# Patient Record
Sex: Male | Born: 1995 | Race: White | Hispanic: No | Marital: Single | Smoking: Never smoker
Health system: Southern US, Community
[De-identification: ages and names within clinical notes are randomized; demographics above are authoritative.]

## PROBLEM LIST (undated history)

## (undated) HISTORY — PX: TONSILLECTOMY: SUR1361

---

## 2018-06-09 ENCOUNTER — Other Ambulatory Visit: Payer: Self-pay

## 2018-06-09 ENCOUNTER — Ambulatory Visit (INDEPENDENT_AMBULATORY_CARE_PROVIDER_SITE_OTHER): Payer: No Typology Code available for payment source

## 2018-06-09 ENCOUNTER — Encounter (HOSPITAL_BASED_OUTPATIENT_CLINIC_OR_DEPARTMENT_OTHER): Payer: Self-pay | Admitting: *Deleted

## 2018-06-09 ENCOUNTER — Encounter (INDEPENDENT_AMBULATORY_CARE_PROVIDER_SITE_OTHER): Payer: Self-pay | Admitting: Orthopaedic Surgery

## 2018-06-09 ENCOUNTER — Ambulatory Visit (INDEPENDENT_AMBULATORY_CARE_PROVIDER_SITE_OTHER): Payer: No Typology Code available for payment source | Admitting: Orthopaedic Surgery

## 2018-06-09 DIAGNOSIS — G8929 Other chronic pain: Secondary | ICD-10-CM | POA: Diagnosis not present

## 2018-06-09 DIAGNOSIS — M25512 Pain in left shoulder: Secondary | ICD-10-CM

## 2018-06-09 DIAGNOSIS — S42002A Fracture of unspecified part of left clavicle, initial encounter for closed fracture: Secondary | ICD-10-CM | POA: Diagnosis not present

## 2018-06-09 NOTE — Progress Notes (Signed)
Office Visit Note   Patient: Anthony Henson           Date of Birth: 1995-12-27           MRN: [id number] Visit Date: 06/09/2018              Requested by: No referring provider defined for this encounter. PCP: Patient, No Pcp Per   Assessment & Plan: Visit Diagnoses:  1. Closed displaced fracture of left clavicle, initial encounter     Plan: Impression is displaced and comminuted left clavicle fracture.  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  Patient would benefit from surgical stabilization to help minimize risk of nonunion and/or malunion and shoulder dysfunction.  Risks and benefits and rehab reviewed with the patient.  He understands and wishes to proceed.  Follow-Up Instructions: Return if symptoms worsen or fail to improve.   Orders:  Orders Placed This Encounter  Procedures  . XR Clavicle Left   No orders of the defined types were placed in this encounter.     Procedures: No procedures performed   Clinical Data: No additional findings.   Subjective: Chief Complaint  Patient presents with  . Left Shoulder - Pain, Fracture    Anthony Henson is a 23 year old healthy gentleman who works for the [employer] who sustained an injury to his left clavicle while attempting a jump while skiing 2 days ago at [employer].  He states that he only has pain when he moves the shoulder.  He has been taking Norco and ibuprofen.  He denies any numbness and tingling.  He does have significant discomfort in the left shoulder region.  He is right-handed.   Review of Systems  Constitutional: Negative.   All other systems reviewed and are negative.    Objective: Vital Signs: There were no vitals taken for this visit.  Physical Exam Vitals signs and nursing note reviewed.  Constitutional:      Appearance: He is well-developed.  HENT:     Head: Normocephalic and atraumatic.  Eyes:     Pupils: Pupils  are equal, round, and reactive to light.  Neck:     Musculoskeletal: Neck supple.  Pulmonary:     Effort: Pulmonary effort is normal.  Abdominal:     Palpations: Abdomen is soft.  Musculoskeletal: Normal range of motion.  Skin:    General: Skin is warm.  Neurological:     Mental Status: He is alert and oriented to person, place, and time.  Psychiatric:        Behavior: Behavior normal.        Thought Content: Thought content normal.        Judgment: Judgment normal.     Ortho Exam Left shoulder exam shows mild swelling with minimal bruising.  He has a small abrasion overlying the anterior aspect of the clavicle.  No neurovascular compromise to the left upper extremity. Specialty Comments:  No specialty comments available.  Imaging: Xr Clavicle Left  Result Date: 06/09/2018 Displaced comminuted shortened left clavicle fracture    PMFS History: There are no active problems to display for this patient.  History reviewed. No pertinent past medical history.  History reviewed. No pertinent family history.  Past Surgical History:  Procedure Laterality Date  . TONSILLECTOMY     Social History   Occupational History  . Not on file  Tobacco Use  . Smoking status: Never Smoker  . Smokeless tobacco: Never  Used  Substance and Sexual Activity  . Alcohol use: Yes    Frequency: Never    Comment: occas  . Drug use: Never  . Sexual activity: Not on file

## 2018-06-10 ENCOUNTER — Ambulatory Visit (HOSPITAL_COMMUNITY): Payer: PRIVATE HEALTH INSURANCE

## 2018-06-10 ENCOUNTER — Encounter (HOSPITAL_BASED_OUTPATIENT_CLINIC_OR_DEPARTMENT_OTHER): Payer: Self-pay | Admitting: *Deleted

## 2018-06-10 ENCOUNTER — Ambulatory Visit (HOSPITAL_BASED_OUTPATIENT_CLINIC_OR_DEPARTMENT_OTHER): Payer: PRIVATE HEALTH INSURANCE | Admitting: Anesthesiology

## 2018-06-10 ENCOUNTER — Ambulatory Visit (HOSPITAL_BASED_OUTPATIENT_CLINIC_OR_DEPARTMENT_OTHER)
Admission: RE | Admit: 2018-06-10 | Discharge: 2018-06-10 | Disposition: A | Discharge status: Home / Self Care | Payer: PRIVATE HEALTH INSURANCE | Source: Ambulatory Visit | Attending: Orthopaedic Surgery | Admitting: Orthopaedic Surgery

## 2018-06-10 ENCOUNTER — Encounter (HOSPITAL_BASED_OUTPATIENT_CLINIC_OR_DEPARTMENT_OTHER)
Admission: RE | Disposition: A | Discharge status: Home / Self Care | Payer: Self-pay | Source: Ambulatory Visit | Attending: Orthopaedic Surgery

## 2018-06-10 ENCOUNTER — Other Ambulatory Visit: Payer: Self-pay

## 2018-06-10 DIAGNOSIS — S42002A Fracture of unspecified part of left clavicle, initial encounter for closed fracture: Secondary | ICD-10-CM

## 2018-06-10 DIAGNOSIS — Y9323 Activity, snow (alpine) (downhill) skiing, snow boarding, sledding, tobogganing and snow tubing: Secondary | ICD-10-CM | POA: Insufficient documentation

## 2018-06-10 DIAGNOSIS — S42022A Displaced fracture of shaft of left clavicle, initial encounter for closed fracture: Secondary | ICD-10-CM | POA: Diagnosis not present

## 2018-06-10 DIAGNOSIS — S42009A Fracture of unspecified part of unspecified clavicle, initial encounter for closed fracture: Secondary | ICD-10-CM

## 2018-06-10 DIAGNOSIS — Z9889 Other specified postprocedural states: Secondary | ICD-10-CM

## 2018-06-10 HISTORY — DX: Fracture of unspecified part of left clavicle, initial encounter for closed fracture: S42.002A

## 2018-06-10 HISTORY — PX: ORIF CLAVICULAR FRACTURE: SHX5055

## 2018-06-10 SURGERY — OPEN REDUCTION INTERNAL FIXATION (ORIF) CLAVICULAR FRACTURE
Anesthesia: General | Site: Shoulder | Laterality: Left

## 2018-06-10 MED ORDER — SUGAMMADEX SODIUM 200 MG/2ML IV SOLN
INTRAVENOUS | Status: AC
  Filled 2018-06-10: qty 2

## 2018-06-10 MED ORDER — OXYCODONE-ACETAMINOPHEN 5-325 MG PO TABS: 1.0000 | ORAL_TABLET | ORAL | 0 refills | Status: DC | PRN

## 2018-06-10 MED ORDER — FENTANYL CITRATE (PF) 100 MCG/2ML IJ SOLN
50.0000 ug | INTRAMUSCULAR | Status: AC | PRN
  Administered 2018-06-10: 25 ug via INTRAVENOUS
  Administered 2018-06-10: 50 ug via INTRAVENOUS
  Administered 2018-06-10: 100 ug via INTRAVENOUS
  Administered 2018-06-10: 25 ug via INTRAVENOUS

## 2018-06-10 MED ORDER — DEXAMETHASONE SODIUM PHOSPHATE 10 MG/ML IJ SOLN
INTRAMUSCULAR | Status: DC | PRN
  Administered 2018-06-10: 4 mg via INTRAVENOUS

## 2018-06-10 MED ORDER — OXYCODONE HCL 5 MG/5ML PO SOLN: 5.0000 mg | Freq: Once | ORAL | Status: DC | PRN

## 2018-06-10 MED ORDER — SCOPOLAMINE 1 MG/3DAYS TD PT72: 1.0000 | MEDICATED_PATCH | Freq: Once | TRANSDERMAL | Status: DC | PRN

## 2018-06-10 MED ORDER — ROCURONIUM BROMIDE 10 MG/ML (PF) SYRINGE
PREFILLED_SYRINGE | INTRAVENOUS | Status: DC | PRN
  Administered 2018-06-10: 50 mg via INTRAVENOUS

## 2018-06-10 MED ORDER — LIDOCAINE 2% (20 MG/ML) 5 ML SYRINGE
INTRAMUSCULAR | Status: DC | PRN
  Administered 2018-06-10: 60 mg via INTRAVENOUS

## 2018-06-10 MED ORDER — MIDAZOLAM HCL 2 MG/2ML IJ SOLN
INTRAMUSCULAR | Status: DC | PRN
  Administered 2018-06-10: 2 mg via INTRAVENOUS

## 2018-06-10 MED ORDER — DEXAMETHASONE SODIUM PHOSPHATE 10 MG/ML IJ SOLN
INTRAMUSCULAR | Status: AC
  Filled 2018-06-10: qty 1

## 2018-06-10 MED ORDER — MIDAZOLAM HCL 2 MG/2ML IJ SOLN: 1.0000 mg | INTRAMUSCULAR | Status: DC | PRN

## 2018-06-10 MED ORDER — CHLORHEXIDINE GLUCONATE 4 % EX LIQD: 60.0000 mL | Freq: Once | CUTANEOUS | Status: DC

## 2018-06-10 MED ORDER — ACETAMINOPHEN 10 MG/ML IV SOLN: 1000.0000 mg | Freq: Once | INTRAVENOUS | Status: DC | PRN

## 2018-06-10 MED ORDER — FENTANYL CITRATE (PF) 100 MCG/2ML IJ SOLN
INTRAMUSCULAR | Status: AC
  Filled 2018-06-10: qty 2

## 2018-06-10 MED ORDER — MIDAZOLAM HCL 2 MG/2ML IJ SOLN
INTRAMUSCULAR | Status: AC
  Filled 2018-06-10: qty 2

## 2018-06-10 MED ORDER — ZINC SULFATE 220 (50 ZN) MG PO CAPS: 220.0000 mg | ORAL_CAPSULE | Freq: Every day | ORAL | 0 refills | Status: DC

## 2018-06-10 MED ORDER — CEFAZOLIN SODIUM-DEXTROSE 2-4 GM/100ML-% IV SOLN
2.0000 g | INTRAVENOUS | Status: AC
  Administered 2018-06-10: 2 g via INTRAVENOUS

## 2018-06-10 MED ORDER — ONDANSETRON HCL 4 MG/2ML IJ SOLN
INTRAMUSCULAR | Status: DC | PRN
  Administered 2018-06-10: 4 mg via INTRAVENOUS

## 2018-06-10 MED ORDER — BUPIVACAINE-EPINEPHRINE (PF) 0.25% -1:200000 IJ SOLN
INTRAMUSCULAR | Status: DC | PRN
  Administered 2018-06-10: 20 mL

## 2018-06-10 MED ORDER — BUPIVACAINE-EPINEPHRINE (PF) 0.25% -1:200000 IJ SOLN
INTRAMUSCULAR | Status: AC
  Filled 2018-06-10: qty 30

## 2018-06-10 MED ORDER — LACTATED RINGERS IV SOLN: INTRAVENOUS | Status: DC

## 2018-06-10 MED ORDER — PROMETHAZINE HCL 25 MG PO TABS: 25.0000 mg | ORAL_TABLET | Freq: Four times a day (QID) | ORAL | 1 refills | Status: DC | PRN

## 2018-06-10 MED ORDER — PROPOFOL 10 MG/ML IV BOLUS
INTRAVENOUS | Status: DC | PRN
  Administered 2018-06-10: 200 mg via INTRAVENOUS

## 2018-06-10 MED ORDER — OXYCODONE HCL ER 10 MG PO T12A: 10.0000 mg | EXTENDED_RELEASE_TABLET | Freq: Two times a day (BID) | ORAL | 0 refills | Status: AC

## 2018-06-10 MED ORDER — SUGAMMADEX SODIUM 200 MG/2ML IV SOLN
INTRAVENOUS | Status: DC | PRN
  Administered 2018-06-10: 200 mg via INTRAVENOUS

## 2018-06-10 MED ORDER — ACETAMINOPHEN 160 MG/5ML PO SOLN: 1000.0000 mg | Freq: Once | ORAL | Status: DC | PRN

## 2018-06-10 MED ORDER — LACTATED RINGERS IV SOLN
INTRAVENOUS | Status: DC
  Administered 2018-06-10 (×2): via INTRAVENOUS

## 2018-06-10 MED ORDER — CEFAZOLIN SODIUM-DEXTROSE 2-4 GM/100ML-% IV SOLN
INTRAVENOUS | Status: AC
  Filled 2018-06-10: qty 100

## 2018-06-10 MED ORDER — OXYCODONE HCL 5 MG PO TABS: 5.0000 mg | ORAL_TABLET | Freq: Once | ORAL | Status: DC | PRN

## 2018-06-10 MED ORDER — ACETAMINOPHEN 500 MG PO TABS: 1000.0000 mg | ORAL_TABLET | Freq: Once | ORAL | Status: DC | PRN

## 2018-06-10 MED ORDER — CALCIUM CARBONATE-VITAMIN D 500-200 MG-UNIT PO TABS: 1.0000 | ORAL_TABLET | Freq: Three times a day (TID) | ORAL | 6 refills | Status: DC

## 2018-06-10 MED ORDER — FENTANYL CITRATE (PF) 100 MCG/2ML IJ SOLN: 25.0000 ug | INTRAMUSCULAR | Status: DC | PRN

## 2018-06-10 MED ORDER — METHOCARBAMOL 500 MG PO TABS: 500.0000 mg | ORAL_TABLET | Freq: Four times a day (QID) | ORAL | 2 refills | Status: DC | PRN

## 2018-06-10 SURGICAL SUPPLY — 63 items
BENZOIN TINCTURE PRP APPL 2/3 (GAUZE/BANDAGES/DRESSINGS) IMPLANT
BLADE SURG 15 STRL LF DISP TIS (BLADE) ×2 IMPLANT
BLADE SURG 15 STRL SS (BLADE) ×4
CLOSURE STERI-STRIP 1/2X4 (GAUZE/BANDAGES/DRESSINGS) ×1
CLOSURE WOUND 1/2 X4 (GAUZE/BANDAGES/DRESSINGS)
CLSR STERI-STRIP ANTIMIC 1/2X4 (GAUZE/BANDAGES/DRESSINGS) ×2 IMPLANT
COVER WAND RF STERILE (DRAPES) IMPLANT
DRAPE C-ARM 42X72 X-RAY (DRAPES) ×3 IMPLANT
DRAPE IMP U-DRAPE 54X76 (DRAPES) ×3 IMPLANT
DRAPE INCISE IOBAN 66X45 STRL (DRAPES) ×3 IMPLANT
DRAPE U-SHAPE 47X51 STRL (DRAPES) ×6 IMPLANT
DRAPE U-SHAPE 76X120 STRL (DRAPES) ×6 IMPLANT
DRILL 2.6X220MM LONG AO (BIT) ×3 IMPLANT
DRSG MEPILEX BORDER 4X8 (GAUZE/BANDAGES/DRESSINGS) IMPLANT
DRSG PAD ABDOMINAL 8X10 ST (GAUZE/BANDAGES/DRESSINGS) ×3 IMPLANT
DRSG TEGADERM 4X10 (GAUZE/BANDAGES/DRESSINGS) ×3 IMPLANT
DURAPREP 26ML APPLICATOR (WOUND CARE) ×3 IMPLANT
ELECT REM PT RETURN 9FT ADLT (ELECTROSURGICAL) ×3
ELECTRODE REM PT RTRN 9FT ADLT (ELECTROSURGICAL) ×1 IMPLANT
GAUZE SPONGE 4X4 12PLY STRL (GAUZE/BANDAGES/DRESSINGS) ×3 IMPLANT
GLOVE BIOGEL PI IND STRL 7.0 (GLOVE) ×1 IMPLANT
GLOVE BIOGEL PI INDICATOR 7.0 (GLOVE) ×2
GLOVE ECLIPSE 7.0 STRL STRAW (GLOVE) ×3 IMPLANT
GLOVE SKINSENSE NS SZ7.5 (GLOVE) ×2
GLOVE SKINSENSE STRL SZ7.5 (GLOVE) ×1 IMPLANT
GLOVE SURG SYN 7.5  E (GLOVE) ×2
GLOVE SURG SYN 7.5 E (GLOVE) ×1 IMPLANT
GOWN SRG XL LVL 4 BRTHBL STRL (GOWNS) ×1 IMPLANT
GOWN STRL NON-REIN XL LVL4 (GOWNS) ×2
GOWN STRL REUS W/ TWL LRG LVL3 (GOWN DISPOSABLE) ×1 IMPLANT
GOWN STRL REUS W/ TWL XL LVL3 (GOWN DISPOSABLE) ×1 IMPLANT
GOWN STRL REUS W/TWL LRG LVL3 (GOWN DISPOSABLE) ×2
GOWN STRL REUS W/TWL XL LVL3 (GOWN DISPOSABLE) ×5 IMPLANT
K-WIRE 1.6X150 (WIRE) ×4
K-WIRE FX150X1.6XKRSH (WIRE) ×2
KWIRE FX150X1.6XKRSH (WIRE) ×2 IMPLANT
MANIFOLD NEPTUNE II (INSTRUMENTS) IMPLANT
PACK ARTHROSCOPY DSU (CUSTOM PROCEDURE TRAY) ×3 IMPLANT
PACK BASIN DAY SURGERY FS (CUSTOM PROCEDURE TRAY) ×3 IMPLANT
PACK VITOSS BIOACTIVE 2.5CC (Neuro Prosthesis/Implant) ×3 IMPLANT
PENCIL BUTTON HOLSTER BLD 10FT (ELECTRODE) ×3 IMPLANT
PLATE CLAVICLE VARIAX LT BRDG (Plate) ×3 IMPLANT
SCREW BONE 3.5X12 (Screw) ×9 IMPLANT
SCREW BONE 3.5X14MM (Screw) ×6 IMPLANT
SCREW LOCKING 14MM (Screw) ×3 IMPLANT
SLEEVE SCD COMPRESS KNEE MED (MISCELLANEOUS) ×3 IMPLANT
SLING ARM FOAM STRAP LRG (SOFTGOODS) IMPLANT
SLING ARM MED ADULT FOAM STRAP (SOFTGOODS) IMPLANT
SLING ARM XL FOAM STRAP (SOFTGOODS) IMPLANT
SPONGE LAP 18X18 RF (DISPOSABLE) ×6 IMPLANT
STRIP CLOSURE SKIN 1/2X4 (GAUZE/BANDAGES/DRESSINGS) IMPLANT
SUCTION FRAZIER HANDLE 10FR (MISCELLANEOUS) ×2
SUCTION TUBE FRAZIER 10FR DISP (MISCELLANEOUS) ×1 IMPLANT
SUT FIBERWIRE #2 38 T-5 BLUE (SUTURE)
SUT MNCRL AB 4-0 PS2 18 (SUTURE) ×3 IMPLANT
SUT VIC AB 0 CT1 18XCR BRD 8 (SUTURE) ×1 IMPLANT
SUT VIC AB 0 CT1 8-18 (SUTURE) ×2
SUT VIC AB 2-0 CT1 27 (SUTURE) ×2
SUT VIC AB 2-0 CT1 TAPERPNT 27 (SUTURE) ×1 IMPLANT
SUTURE FIBERWR #2 38 T-5 BLUE (SUTURE) IMPLANT
SYR BULB 3OZ (MISCELLANEOUS) ×3 IMPLANT
TOWEL GREEN STERILE FF (TOWEL DISPOSABLE) ×3 IMPLANT
YANKAUER SUCT BULB TIP NO VENT (SUCTIONS) ×3 IMPLANT

## 2018-06-10 NOTE — Anesthesia Procedure Notes (Signed)
Procedure Name: Intubation Date/Time: 06/10/2018 12:07 PM Performed by: Myna Bright, CRNA Pre-anesthesia Checklist: Patient identified, Emergency Drugs available, Suction available and Patient being monitored Patient Re-evaluated:Patient Re-evaluated prior to induction Oxygen Delivery Method: Circle system utilized Preoxygenation: Pre-oxygenation with 100% oxygen Induction Type: IV induction Ventilation: Mask ventilation without difficulty Laryngoscope Size: Mac and 4 Grade View: Grade I Tube type: Oral Tube size: 8.0 mm Number of attempts: 1 Airway Equipment and Method: Stylet Placement Confirmation: ETT inserted through vocal cords under direct vision,  positive ETCO2 and breath sounds checked- equal and bilateral Secured at: 22 cm Tube secured with: Tape Dental Injury: Teeth and Oropharynx as per pre-operative assessment

## 2018-06-10 NOTE — Anesthesia Preprocedure Evaluation (Signed)
Anesthesia Evaluation  Patient identified by MRN, date of birth, ID band Patient awake    Reviewed: Allergy & Precautions, NPO status , Patient's Chart, lab work & pertinent test results  History of Anesthesia Complications Negative for: history of anesthetic complications  Airway Mallampati: II  TM Distance: >3 FB Neck ROM: Full    Dental  (+) Teeth Intact   Pulmonary neg pulmonary ROS,    breath sounds clear to auscultation       Cardiovascular negative cardio ROS   Rhythm:Regular     Neuro/Psych negative neurological ROS  negative psych ROS   GI/Hepatic negative GI ROS, Neg liver ROS,   Endo/Other  negative endocrine ROS  Renal/GU negative Renal ROS     Musculoskeletal left clavicle fracture   Abdominal   Peds  Hematology negative hematology ROS (+)   Anesthesia Other Findings   Reproductive/Obstetrics                             Anesthesia Physical Anesthesia Plan  ASA: I  Anesthesia Plan: General   Post-op Pain Management:    Induction: Intravenous  PONV Risk Score and Plan: 2 and Ondansetron and Dexamethasone  Airway Management Planned: Oral ETT  Additional Equipment: None  Intra-op Plan:   Post-operative Plan: Extubation in OR  Informed Consent: I have reviewed the patients History and Physical, chart, labs and discussed the procedure including the risks, benefits and alternatives for the proposed anesthesia with the patient or authorized representative who has indicated his/her understanding and acceptance.     Dental advisory given  Plan Discussed with: CRNA and Surgeon  Anesthesia Plan Comments:         Anesthesia Quick Evaluation

## 2018-06-10 NOTE — H&P (Signed)
H&P update  The surgical history has been reviewed and remains accurate without interval change.  The patient was re-examined and patient's physiologic condition has not changed significantly in the last 30 days. The condition still exists that makes this procedure necessary. The treatment plan remains the same, without new options for care.  No new pharmacological allergies or types of therapy has been initiated that would change the plan or the appropriateness of the plan.  The patient and/or family understand the potential benefits and risks.  Mayra Reel, MD 06/10/2018 8:09 AM

## 2018-06-10 NOTE — Discharge Instructions (Signed)
Postoperative instructions:  Weightbearing instructions: non weight bearing  Keep your dressing and/or splint clean and dry at all times.  You can remove your dressing on post-operative day #3 and change with a dry/sterile dressing or Band-Aids as needed thereafter.    Incision instructions:  Do not soak your incision for 3 weeks after surgery.  If the incision gets wet, pat dry and do not scrub the incision.  Pain control:  You have been given a prescription to be taken as directed for post-operative pain control.  In addition, elevate the operative extremity above the heart at all times to prevent swelling and throbbing pain.  Take over-the-counter Colace, 100mg  by mouth twice a day while taking narcotic pain medications to help prevent constipation.  Follow up appointments: 1) 12-14 days for suture removal and wound check. 2) Dr. Roda Shutters as scheduled.   -------------------------------------------------------------------------------------------------------------  After Surgery Pain Control:  After your surgery, post-surgical discomfort or pain is likely. This discomfort can last several days to a few weeks. At certain times of the day your discomfort may be more intense.  Did you receive a nerve block?  A nerve block can provide pain relief for one hour to two days after your surgery. As long as the nerve block is working, you will experience little or no sensation in the area the surgeon operated on.  As the nerve block wears off, you will begin to experience pain or discomfort. It is very important that you begin taking your prescribed pain medication before the nerve block fully wears off. Treating your pain at the first sign of the block wearing off will ensure your pain is better controlled and more tolerable when full-sensation returns. Do not wait until the pain is intolerable, as the medicine will be less effective. It is better to treat pain in advance than to try and catch up.  General  Anesthesia:  If you did not receive a nerve block during your surgery, you will need to start taking your pain medication shortly after your surgery and should continue to do so as prescribed by your surgeon.  Pain Medication:  Most commonly we prescribe Vicodin and Percocet for post-operative pain. Both of these medications contain a combination of acetaminophen (Tylenol) and a narcotic to help control pain.   It takes between 30 and 45 minutes before pain medication starts to work. It is important to take your medication before your pain level gets too intense.   Nausea is a common side effect of many pain medications. You will want to eat something before taking your pain medicine to help prevent nausea.   If you are taking a prescription pain medication that contains acetaminophen, we recommend that you do not take additional over the counter acetaminophen (Tylenol).  Other pain relieving options:   Using a cold pack to ice the affected area a few times a day (15 to 20 minutes at a time) can help to relieve pain, reduce swelling and bruising.   Elevation of the affected area can also help to reduce pain and swelling.      Post Anesthesia Home Care Instructions  Activity: Get plenty of rest for the remainder of the day. A responsible individual must stay with you for 24 hours following the procedure.  For the next 24 hours, DO NOT: -Drive a car -Advertising copywriter -Drink alcoholic beverages -Take any medication unless instructed by your physician -Make any legal decisions or sign important papers.  Meals: Start with liquid foods such as  gelatin or soup. Progress to regular foods as tolerated. Avoid greasy, spicy, heavy foods. If nausea and/or vomiting occur, drink only clear liquids until the nausea and/or vomiting subsides. Call your physician if vomiting continues.  Special Instructions/Symptoms: Your throat may feel dry or sore from the anesthesia or the breathing tube  placed in your throat during surgery. If this causes discomfort, gargle with warm salt water. The discomfort should disappear within 24 hours.  If you had a scopolamine patch placed behind your ear for the management of post- operative nausea and/or vomiting:  1. The medication in the patch is effective for 72 hours, after which it should be removed.  Wrap patch in a tissue and discard in the trash. Wash hands thoroughly with soap and water. 2. You may remove the patch earlier than 72 hours if you experience unpleasant side effects which may include dry mouth, dizziness or visual disturbances. 3. Avoid touching the patch. Wash your hands with soap and water after contact with the patch.      Regional Anesthesia Blocks  1. Numbness or the inability to move the "blocked" extremity may last from 3-48 hours after placement. The length of time depends on the medication injected and your individual response to the medication. If the numbness is not going away after 48 hours, call your surgeon.  2. The extremity that is blocked will need to be protected until the numbness is gone and the  Strength has returned. Because you cannot feel it, you will need to take extra care to avoid injury. Because it may be weak, you may have difficulty moving it or using it. You may not know what position it is in without looking at it while the block is in effect.  3. For blocks in the legs and feet, returning to weight bearing and walking needs to be done carefully. You will need to wait until the numbness is entirely gone and the strength has returned. You should be able to move your leg and foot normally before you try and bear weight or walk. You will need someone to be with you when you first try to ensure you do not fall and possibly risk injury.  4. Bruising and tenderness at the needle site are common side effects and will resolve in a few days.  5. Persistent numbness or new problems with movement should be  communicated to the surgeon or the [facility] [id number] [facility] [phone]).

## 2018-06-10 NOTE — Op Note (Signed)
   Date of Surgery: 06/10/2018  INDICATIONS: Mr. Saade is a 23 y.o.-year-old male who sustained a left clavicle fracture;  The patient did consent to the procedure after discussion of the risks and benefits.  PREOPERATIVE DIAGNOSIS: Left displaced clavicle fracture  POSTOPERATIVE DIAGNOSIS: Same.  PROCEDURE: Open treatment of left clavicle fracture with internal fixation  SURGEON: N. Glee Arvin, M.D.  ASSIST: Starlyn Skeans [place], [place]; necessary for the timely completion of procedure and due to complexity of procedure..  ANESTHESIA:  general, local  IV FLUIDS AND URINE: See anesthesia.  ESTIMATED BLOOD LOSS: minimal mL.  IMPLANTS: [employer]   COMPLICATIONS: None.  DESCRIPTION OF PROCEDURE: The patient was brought to the operating room and placed supine on the operating table.  The patient had been signed prior to the procedure and this was documented. The patient had the anesthesia placed by the anesthesiologist.  The patient was then placed in the beach chair position.  All bony prominences were well padded.  A time-out was performed to confirm that this was the correct patient, site, side and location. The patient had an SCD on the lower extremities. The patient did receive antibiotics prior to the incision and was re-dosed during the procedure as needed at indicated intervals.  The patient had the operative extremity prepped and draped in the standard surgical fashion.    A horizontal incision based over the clavicle was used.  Cutaneous nerves were identified and protected as much as possible.  Full thickness flaps were elevated off of the clavicle.  The fracture was exposed.  There was extensive comminution of the fracture.  No lag fixation was possible.  Any organized hematoma and entrapped periosteum was retrieved from the fracture site. Length and alignment of the fracture were obtained using clamps and a superior plate was applied to the clavicle.  The appropriate length was  found by using fluoroscopy.  With the plate in the appropriate position and the fracture reduced, nonlocking and locking screws were placed through the plate and into the clavicle using standard AO technique.  Care was taken not to plunge with any of the instruments.  Retractors were used as added protection to the neurovascular and pulmonary structures.  2.5 cc of vitoss was placed in the fracture site to help promote healing.  Final fluoroscopy pictures were taken to confirm plate placement and fracture reduction.  The wound was thoroughly irrigated and closed in a layer fashion using 0 vicryl, 2.0 vicryl and 4.0 monocryl and steri strips.  Sterile dressings were applied and the patient was extubated and transferred to the PACU in stable condition.  POSTOPERATIVE PLAN: Patient will be in a sling for comfort.  He is allowed to range his shoulder up to the level of the shoulder and not allowed to lift anything.  Observation overnight for pain control and discharge home in the morning.  Mayra Reel, MD [facility] [phone] 8:10 AM

## 2018-06-11 NOTE — Anesthesia Postprocedure Evaluation (Signed)
Anesthesia Post Note  Patient: Anthony Henson  Procedure(s) Performed: OPEN REDUCTION INTERNAL FIXATION (ORIF) LEFT CLAVICLE FRACTURE (Left Shoulder)     Patient location during evaluation: PACU Anesthesia Type: General Level of consciousness: awake and alert Pain management: pain level controlled Vital Signs Assessment: post-procedure vital signs reviewed and stable Respiratory status: spontaneous breathing, nonlabored ventilation, respiratory function stable and patient connected to nasal cannula oxygen Cardiovascular status: blood pressure returned to baseline and stable Postop Assessment: no apparent nausea or vomiting Anesthetic complications: no    Last Vitals:  Vitals:   06/10/18 1415 06/10/18 1500  BP: 134/79 121/67  Pulse: 82 73  Resp: 14 18  Temp:  36.5 C  SpO2: 99% 100%    Last Pain:  Vitals:   06/10/18 1500  TempSrc:   PainSc: 0-No pain                 Tanza Pellot

## 2018-06-11 NOTE — Transfer of Care (Signed)
Immediate Anesthesia Transfer of Care Note  Patient: Anthony Henson  Procedure(s) Performed: OPEN REDUCTION INTERNAL FIXATION (ORIF) LEFT CLAVICLE FRACTURE (Left Shoulder)  Patient Location: PACU  Anesthesia Type:General  Level of Consciousness: awake and alert   Airway & Oxygen Therapy: Patient Spontanous Breathing and Patient connected to face mask oxygen  Post-op Assessment: Report given to RN and Post -op Vital signs reviewed and stable  Post vital signs: Reviewed and stable  Last Vitals:  Vitals Value Taken Time  BP    Temp    Pulse    Resp    SpO2      Last Pain:  Vitals:   06/10/18 1500  TempSrc:   PainSc: 0-No pain      Patients Stated Pain Goal: 1 (06/10/18 0948)  Complications: No apparent anesthesia complications

## 2018-06-12 ENCOUNTER — Encounter (HOSPITAL_BASED_OUTPATIENT_CLINIC_OR_DEPARTMENT_OTHER): Payer: Self-pay | Admitting: Orthopaedic Surgery

## 2018-06-24 ENCOUNTER — Ambulatory Visit (INDEPENDENT_AMBULATORY_CARE_PROVIDER_SITE_OTHER): Payer: No Typology Code available for payment source | Admitting: Orthopaedic Surgery

## 2018-06-24 ENCOUNTER — Encounter (INDEPENDENT_AMBULATORY_CARE_PROVIDER_SITE_OTHER): Payer: Self-pay | Admitting: Orthopaedic Surgery

## 2018-06-24 ENCOUNTER — Ambulatory Visit (INDEPENDENT_AMBULATORY_CARE_PROVIDER_SITE_OTHER): Payer: No Typology Code available for payment source

## 2018-06-24 DIAGNOSIS — S42002A Fracture of unspecified part of left clavicle, initial encounter for closed fracture: Secondary | ICD-10-CM | POA: Diagnosis not present

## 2018-06-24 NOTE — Progress Notes (Signed)
   Post-Op Visit Note   Patient: Anthony Henson           Date of Birth: 1995/11/17           MRN: [id number] Visit Date: 06/24/2018 PCP: Patient, No Pcp Per   Assessment & Plan:  Chief Complaint:  Chief Complaint  Patient presents with  . Left Shoulder - Follow-up   Visit Diagnoses:  1. Closed displaced fracture of left clavicle, initial encounter     Plan: Robby is two-week status post ORIF left clavicle fracture.  He is doing well today.  He reports no significant pain.  He has not been wearing a sling.  He is not taking any pain medicines.  His surgical incision has healed without any evidence of infection.  Steri-Strips were removed today.  Precautions and limitations were discussed and it sounds like there is no light duty available at work so we will hold him out of work for another 4 weeks until he comes back to see [place].  All questions answered to his satisfaction.  Follow-up in 4 weeks with two-view x-rays left clavicle.  Follow-Up Instructions: Return in about 4 weeks (around 07/22/2018).   Orders:  Orders Placed This Encounter  Procedures  . XR Clavicle Left   No orders of the defined types were placed in this encounter.   Imaging: Xr Clavicle Left  Result Date: 06/24/2018 Stable fixation and alignment of left clavicle fracture.   PMFS History: Patient Active Problem List   Diagnosis Date Noted  . Closed displaced fracture of left clavicle, initial encounter 06/10/2018   No past medical history on file.  No family history on file.  Past Surgical History:  Procedure Laterality Date  . ORIF CLAVICULAR FRACTURE Left 06/10/2018   Procedure: OPEN REDUCTION INTERNAL FIXATION (ORIF) LEFT CLAVICLE FRACTURE;  Surgeon: Tarry Kos, MD;  Location: [facility];  Service: Orthopedics;  Laterality: Left;  . TONSILLECTOMY     Social History   Occupational History  . Not on file  Tobacco Use  . Smoking status: Never Smoker  . Smokeless tobacco: Never Used    Substance and Sexual Activity  . Alcohol use: Yes    Frequency: Never    Comment: occas  . Drug use: Never  . Sexual activity: Not on file

## 2018-07-22 ENCOUNTER — Telehealth (INDEPENDENT_AMBULATORY_CARE_PROVIDER_SITE_OTHER): Payer: Self-pay

## 2018-07-22 ENCOUNTER — Ambulatory Visit (INDEPENDENT_AMBULATORY_CARE_PROVIDER_SITE_OTHER): Payer: No Typology Code available for payment source | Admitting: Orthopaedic Surgery

## 2018-07-22 NOTE — Telephone Encounter (Signed)
Called patient. Mailbox full. Cannot leave any voicemail's. If patient calls back please ask questions below. Thank you.  Do you have now or have you had in the past 7 days a fever and/or chills?   Do you have now or have you had in the past 7 days a cough?   Do you have now or have you had in the last 7 days nausea, vomiting or abdominal pain?   Have you been exposed to anyone who has tested positive for COVID-19?   Have you or anyone who lives with you traveled within the last month?

## 2018-07-23 ENCOUNTER — Other Ambulatory Visit: Payer: Self-pay

## 2018-07-23 ENCOUNTER — Ambulatory Visit (INDEPENDENT_AMBULATORY_CARE_PROVIDER_SITE_OTHER): Payer: No Typology Code available for payment source | Admitting: Physician Assistant

## 2018-07-23 ENCOUNTER — Encounter (INDEPENDENT_AMBULATORY_CARE_PROVIDER_SITE_OTHER): Payer: Self-pay | Admitting: Orthopaedic Surgery

## 2018-07-23 ENCOUNTER — Ambulatory Visit (INDEPENDENT_AMBULATORY_CARE_PROVIDER_SITE_OTHER): Payer: No Typology Code available for payment source

## 2018-07-23 DIAGNOSIS — S42002D Fracture of unspecified part of left clavicle, subsequent encounter for fracture with routine healing: Secondary | ICD-10-CM

## 2018-07-23 NOTE — Progress Notes (Signed)
   Post-Op Visit Note   Patient: Anthony Henson           Date of Birth: Jul 13, 1995           MRN: [id number] Visit Date: 07/23/2018 PCP: Patient, No Pcp Per   Assessment & Plan:  Chief Complaint:  Chief Complaint  Patient presents with  . Left Shoulder - Follow-up   Visit Diagnoses:  1. 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  He has been doing well.  He has minimal pain.  He has not been working as he claims there is no light duty option.  Examination of his left clavicle reveals well-healed surgical incision without evidence of infection.  Minimal tenderness to the distal clavicle.  He has about 80% active range of motion.  At this point, we will transition him into an outpatient physical therapy setting start work on strengthening exercises.  He will avoid lifting more than 5 pounds left upper extremity.  We will continue his out of work note for another 6 weeks, should he find that there is a light duty option, he will call and let us know.  follow-up with [place] in 6 weeks time for repeat evaluation and x-rays  Follow-Up Instructions: Return in about 6 weeks (around 09/03/2018).   Orders:  Orders Placed This Encounter  Procedures  . XR Clavicle Left  . Ambulatory referral to Physical Therapy   No orders of the defined types were placed in this encounter.   Imaging: Xr Clavicle Left  Result Date: 07/23/2018 X-rays demonstrate stable alignment of the fracture with evidence of bony consolidation   PMFS History: Patient Active Problem List   Diagnosis Date Noted  . Closed displaced fracture of left clavicle, initial encounter 06/10/2018   History reviewed. No pertinent past medical history.  History reviewed. No pertinent family history.  Past  Surgical History:  Procedure Laterality Date  . ORIF CLAVICULAR FRACTURE Left 06/10/2018   Procedure: OPEN REDUCTION INTERNAL FIXATION (ORIF) LEFT CLAVICLE FRACTURE;  Surgeon: Tarry Kos, MD;  [facility];  Service: Orthopedics;  Laterality: Left;  . TONSILLECTOMY     Social History   Occupational History  . Not on file  Tobacco Use  . Smoking status: Never Smoker  . Smokeless tobacco: Never Used  Substance and Sexual Activity  . Alcohol use: Yes    Frequency: Never    Comment: occas  . Drug use: Never  . Sexual activity: Not on file

## 2018-09-03 ENCOUNTER — Other Ambulatory Visit: Payer: Self-pay

## 2018-09-03 ENCOUNTER — Ambulatory Visit (INDEPENDENT_AMBULATORY_CARE_PROVIDER_SITE_OTHER): Payer: PRIVATE HEALTH INSURANCE

## 2018-09-03 ENCOUNTER — Ambulatory Visit: Payer: No Typology Code available for payment source | Admitting: Orthopaedic Surgery

## 2018-09-03 DIAGNOSIS — S42022K Displaced fracture of shaft of left clavicle, subsequent encounter for fracture with nonunion: Secondary | ICD-10-CM

## 2018-09-03 DIAGNOSIS — S42002A Fracture of unspecified part of left clavicle, initial encounter for closed fracture: Secondary | ICD-10-CM | POA: Diagnosis not present

## 2018-09-03 NOTE — Progress Notes (Signed)
   Post-Op Visit Note   Patient: Anthony Henson           Date of Birth: June 26, 1995           MRN: [id number] Visit Date: 09/03/2018 PCP: Patient, No Pcp Per   Assessment & Plan:  Chief Complaint:  Chief Complaint  Patient presents with  . Left Shoulder - Follow-up   Visit Diagnoses:  1. 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  Clinically he states that he is doing great and reports no problems.  He has been out of work since the injury and surgery.  He denies any constitutional symptoms or signs or symptoms of infection.  Surgical scar is fully healed.  He does have some redness around the scar but there is no evidence of infection.  The plate is fairly prominent in the subcutaneous space.  His x-rays showed no complications with the hardware in stable alignment but the fracture itself does not demonstrate significant improvement in healing or bony consolidation compared to the prior x-ray.  At this time I am concerned that he may be developing a nonunion and therefore we will need to order a bone stimulator for Kejuan to help promote healing.  From my standpoint I think it would be safe for him to return to work on light duty which means no repetitive or heavy lifting with the left arm and no strenuous physical use of the left arm.  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  Recheck in 6 weeks with two-view x-rays of the left clavicle.  Follow-Up Instructions: Return in about 6 weeks (around 10/15/2018).   Orders:  Orders Placed This Encounter  Procedures  . XR Clavicle Left   No orders of the defined types were placed in this encounter.   Imaging: Xr Clavicle Left  Result Date: 09/03/2018 Stable alignment and fixation of the clavicle fracture.  There is not a significant amount of bony consolidation healing I can appreciate compared to the previous x-ray.   Possibly some resorption of the fracture.   PMFS History: Patient Active Problem List   Diagnosis Date Noted  . Closed displaced fracture of left clavicle, initial encounter 06/10/2018   No past medical history on file.  No family history on file.  Past Surgical History:  Procedure Laterality Date  . ORIF CLAVICULAR FRACTURE Left 06/10/2018   Procedure: OPEN REDUCTION INTERNAL FIXATION (ORIF) LEFT CLAVICLE FRACTURE;  Surgeon: Tarry Kos, MD;  Location: [facility];  Service: Orthopedics;  Laterality: Left;  . TONSILLECTOMY     Social History   Occupational History  . Not on file  Tobacco Use  . Smoking status: Never Smoker  . Smokeless tobacco: Never Used  Substance and Sexual Activity  . Alcohol use: Yes    Frequency: Never    Comment: occas  . Drug use: Never  . Sexual activity: Not on file

## 2018-10-15 ENCOUNTER — Ambulatory Visit (INDEPENDENT_AMBULATORY_CARE_PROVIDER_SITE_OTHER): Payer: PRIVATE HEALTH INSURANCE

## 2018-10-15 ENCOUNTER — Ambulatory Visit (INDEPENDENT_AMBULATORY_CARE_PROVIDER_SITE_OTHER): Payer: PRIVATE HEALTH INSURANCE | Admitting: Orthopaedic Surgery

## 2018-10-15 ENCOUNTER — Other Ambulatory Visit: Payer: Self-pay

## 2018-10-15 ENCOUNTER — Encounter: Payer: Self-pay | Admitting: Orthopaedic Surgery

## 2018-10-15 DIAGNOSIS — S42022K Displaced fracture of shaft of left clavicle, subsequent encounter for fracture with nonunion: Secondary | ICD-10-CM

## 2018-10-15 NOTE — Progress Notes (Signed)
   Post-Op Visit Note   Patient: Anthony Henson           Date of Birth: [birth year]           MRN: [id number] Visit Date: 10/15/2018 PCP: Patient, No Pcp Per   Assessment & Plan:  Chief Complaint:  Chief Complaint  Patient presents with  . Follow-up   Visit Diagnoses:  1. 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  He comes in today for follow-up.  He just started the bone stimulator about a week and a half ago.  He reports no pain with any use of the left upper extremity.  His surgical scar is fully healed and the fracture is nontender.  The plate slightly prominent underneath the skin.  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  I discussed with him that should he have any issues he is welcome to call us back if he has any issues going back to work without restrictions.  He is to continue with the bone stimulator every day until his follow-up in 2 months.  He will need 2 view x-rays of the left clavicle on return.  Follow-Up Instructions: Return in about 2 months (around 12/15/2018).   Orders:  Orders Placed This Encounter  Procedures  . XR Clavicle Left   No orders of the defined types were placed in this encounter.   Imaging: Xr Clavicle Left  Result Date: 10/15/2018 There are no complications of the hardware.  The alignment of the fracture is stable.  The fracture continues to demonstrate lucency.  No significant improvement in fracture healing compared to the previous x-ray.   PMFS History: Patient Active Problem List   Diagnosis Date Noted  . Closed displaced fracture of left clavicle, initial encounter 06/10/2018   History reviewed. No pertinent past medical history.  History reviewed. No pertinent family history.  Past Surgical History:  Procedure Laterality Date  . ORIF CLAVICULAR FRACTURE Left  06/10/2018   Procedure: OPEN REDUCTION INTERNAL FIXATION (ORIF) LEFT CLAVICLE FRACTURE;  Surgeon: Leandrew Koyanagi, MD;  Location: [facility];  Service: Orthopedics;  Laterality: Left;  . TONSILLECTOMY     Social History   Occupational History  . Not on file  Tobacco Use  . Smoking status: Never Smoker  . Smokeless tobacco: Never Used  Substance and Sexual Activity  . Alcohol use: Yes    Frequency: Never    Comment: occas  . Drug use: Never  . Sexual activity: Not on file

## 2018-12-16 ENCOUNTER — Ambulatory Visit (INDEPENDENT_AMBULATORY_CARE_PROVIDER_SITE_OTHER): Payer: PRIVATE HEALTH INSURANCE

## 2018-12-16 ENCOUNTER — Encounter: Payer: Self-pay | Admitting: Orthopaedic Surgery

## 2018-12-16 ENCOUNTER — Ambulatory Visit (INDEPENDENT_AMBULATORY_CARE_PROVIDER_SITE_OTHER): Payer: PRIVATE HEALTH INSURANCE | Admitting: Orthopaedic Surgery

## 2018-12-16 DIAGNOSIS — S42022K Displaced fracture of shaft of left clavicle, subsequent encounter for fracture with nonunion: Secondary | ICD-10-CM

## 2018-12-16 NOTE — Progress Notes (Signed)
   Office Visit Note   Patient: Anthony Henson           Date of Birth: 1995/12/17           MRN: [id number] Visit Date: 12/16/2018              Requested by: No referring provider defined for this encounter. PCP: Patient, No Pcp Per   Assessment & Plan: Visit Diagnoses:  1. 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  The hardware is without complication.  At this point Anthony Henson has shown that he is clinically healed.  Activity wise he is back to normal.  His x-rays look significantly better today compared to 2 months ago.  At this point I am going to release him.  Questions encouraged and answered.  Follow-up as needed.  Follow-Up Instructions: Return if symptoms worsen or fail to improve.   Orders:  Orders Placed This Encounter  Procedures  . XR Clavicle Left   No orders of the defined types were placed in this encounter.     Procedures: No procedures performed   Clinical Data: No additional findings.   Subjective: Chief Complaint  Patient presents with  . Follow-up    Glenwood presents today for his 68-month postsurgical visit status post ORIF left clavicle fracture.  He states that he has done very well.  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  He is not had any issues.  He denies any pain.  He states that the plate is prominent but otherwise he has not had any issues.   Review of Systems   Objective: Vital Signs: There were no vitals taken for this visit.  Physical Exam  Ortho Exam Fully healed surgical scar.  He has full active and passive range of motion of the shoulder without any pain or weakness. Specialty Comments:  No specialty comments available.  Imaging: Xr Clavicle Left  Result Date: 12/16/2018 Significant bridging bony consolidation.  No hardware complications.    PMFS  History: Patient Active Problem List   Diagnosis Date Noted  . Closed displaced fracture of left clavicle, initial encounter 06/10/2018   History reviewed. No pertinent past medical history.  History reviewed. No pertinent family history.  Past Surgical History:  Procedure Laterality Date  . ORIF CLAVICULAR FRACTURE Left 06/10/2018   Procedure: OPEN REDUCTION INTERNAL FIXATION (ORIF) LEFT CLAVICLE FRACTURE;  Surgeon: Leandrew Koyanagi, MD;  Location: [facility];  Service: Orthopedics;  Laterality: Left;  . TONSILLECTOMY     Social History   Occupational History  . Not on file  Tobacco Use  . Smoking status: Never Smoker  . Smokeless tobacco: Never Used  Substance and Sexual Activity  . Alcohol use: Yes    Frequency: Never    Comment: occas  . Drug use: Never  . Sexual activity: Not on file

## 2020-01-17 DIAGNOSIS — R9431 Abnormal electrocardiogram [ECG] [EKG]: Secondary | ICD-10-CM | POA: Diagnosis not present

## 2020-01-18 ENCOUNTER — Telehealth: Payer: Self-pay | Admitting: Cardiology

## 2020-01-18 NOTE — Telephone Encounter (Signed)
Patient called and stated that he was seen by Dr. Iran Ouch from the emergency room and to make an appt with Dr. Servando Salina in 2 days. Looked and did not see any notes from Dr. Dewayne Hatch nurse to call him to recheck.

## 2020-02-01 ENCOUNTER — Other Ambulatory Visit: Payer: Self-pay | Admitting: *Deleted

## 2020-02-07 ENCOUNTER — Ambulatory Visit (INDEPENDENT_AMBULATORY_CARE_PROVIDER_SITE_OTHER): Payer: PRIVATE HEALTH INSURANCE

## 2020-02-07 ENCOUNTER — Ambulatory Visit: Payer: PRIVATE HEALTH INSURANCE | Admitting: Cardiology

## 2020-02-07 ENCOUNTER — Encounter: Payer: Self-pay | Admitting: Cardiology

## 2020-02-07 ENCOUNTER — Other Ambulatory Visit: Payer: Self-pay

## 2020-02-07 VITALS — BP 108/70 | HR 80 | Ht 73.0 in | Wt 202.8 lb

## 2020-02-07 DIAGNOSIS — R0789 Other chest pain: Secondary | ICD-10-CM

## 2020-02-07 DIAGNOSIS — I493 Ventricular premature depolarization: Secondary | ICD-10-CM

## 2020-02-07 NOTE — Progress Notes (Signed)
Cardiology Office Note:    Date:  02/07/2020   ID:  Anthony Henson, DOB 1995/12/07, MRN [id number]  PCP:  Patient, No Pcp Per  Cardiologist:  Berniece Salines, DO  Electrophysiologist:  None   Referring MD: No ref. provider found   " I am doing well"  History of Present Illness:    Anthony Henson is a 24 y.o. male with a hx of no significant medical history presents today to be evaluated post emergency department visit.  Patient did present to the emergency department on January 17, 2020 at that time he complained of chest pain.  Is right-sided intact he tells me it was a aching sensation.  He notes that he also had associated palpitations with this.  On his presentation to the emergency department he did get an echocardiogram which showed trivial pericardial effusion otherwise structurally normal heart.  His EKG did show some early repolarization.  He was discharged home with ibuprofen.  ESR and CRP was also normal as concern for inflammatory process was raised.  He tells me since his ED visit he was able to complete the ibuprofen doses and has not been feeling better.  But does have some intermittent palpitations along with lightheadedness.  He tells me is mostly at night he wakes up he feels some shortness of breath when this happens.  He has not passed out.  History reviewed. No pertinent past medical history.  Past Surgical History:  Procedure Laterality Date  . ORIF CLAVICULAR FRACTURE Left 06/10/2018   Procedure: OPEN REDUCTION INTERNAL FIXATION (ORIF) LEFT CLAVICLE FRACTURE;  Surgeon: Leandrew Koyanagi, MD;  Location: [place];  Service: Orthopedics;  Laterality: Left;  . TONSILLECTOMY      Current Medications: Current Meds  Medication Sig  . ibuprofen (ADVIL,MOTRIN) 600 MG tablet Take 600 mg by mouth 3 (three) times daily.     Allergies:   Patient has no known allergies.   Social History   Socioeconomic History  . Marital status: Single    Spouse name: Not on file  .  Number of children: Not on file  . Years of education: Not on file  . Highest education level: Not on file  Occupational History  . Not on file  Tobacco Use  . Smoking status: Never Smoker  . Smokeless tobacco: Never Used  Vaping Use  . Vaping Use: Never used  Substance and Sexual Activity  . Alcohol use: Yes    Comment: occas  . Drug use: Never  . Sexual activity: Not on file  Other Topics Concern  . Not on file  Social History Narrative  . Not on file   Social Determinants of Health   Financial Resource Strain:   . Difficulty of Paying Living Expenses: Not on file  Food Insecurity:   . Worried About Charity fundraiser in the Last Year: Not on file  . Ran Out of Food in the Last Year: Not on file  Transportation Needs:   . Lack of Transportation (Medical): Not on file  . Lack of Transportation (Non-Medical): Not on file  Physical Activity:   . Days of Exercise per Week: Not on file  . Minutes of Exercise per Session: Not on file  Stress:   . Feeling of Stress : Not on file  Social Connections:   . Frequency of Communication with Friends and Family: Not on file  . Frequency of Social Gatherings with Friends and Family: Not on file  . Attends Religious Services: Not  on file  . Active Member of Clubs or Organizations: Not on file  . Attends Archivist Meetings: Not on file  . Marital Status: Not on file     Family History: The patient's family history includes Thyroid disease in his mother.  ROS:   Review of Systems  Constitution: Negative for decreased appetite, fever and weight gain.  HENT: Negative for congestion, ear discharge, hoarse voice and sore throat.   Eyes: Negative for discharge, redness, vision loss in right eye and visual halos.  Cardiovascular: Negative for chest pain, dyspnea on exertion, leg swelling, orthopnea and palpitations.  Respiratory: Negative for cough, hemoptysis, shortness of breath and snoring.   Endocrine: Negative for heat  intolerance and polyphagia.  Hematologic/Lymphatic: Negative for bleeding problem. Does not bruise/bleed easily.  Skin: Negative for flushing, nail changes, rash and suspicious lesions.  Musculoskeletal: Negative for arthritis, joint pain, muscle cramps, myalgias, neck pain and stiffness.  Gastrointestinal: Negative for abdominal pain, bowel incontinence, diarrhea and excessive appetite.  Genitourinary: Negative for decreased libido, genital sores and incomplete emptying.  Neurological: Negative for brief paralysis, focal weakness, headaches and loss of balance.  Psychiatric/Behavioral: Negative for altered mental status, depression and suicidal ideas.  Allergic/Immunologic: Negative for HIV exposure and persistent infections.    EKGs/Labs/Other Studies Reviewed:    The following studies were reviewed today:   EKG:  The ekg ordered today demonstrates sinus rhythm, heart rate 80 bpm with rare PVCs. His echocardiogram done at [facility] for 21 December 2025 which was read by me showed global left ventricular systolic function 65 to 86%  Ventricle normal size.  Left atrium normal size.  Right atrium normal size and function.  Aortic valve is trileaflet again.  Structurally normal.  There is trace mitral regurgitation.  The tricuspid valve appears structurally normal.  Trace to mild pulmonary regurgitation.  There is trivial pericardial effusion.  Recent Labs: No results found for requested labs within last 8760 hours.  Recent Lipid Panel No results found for: CHOL, TRIG, HDL, CHOLHDL, VLDL, LDLCALC, LDLDIRECT  Physical Exam:    VS:  BP 108/70   Pulse 80   Ht '6\' 1"'  (1.854 m)   Wt 202 lb 12.8 oz (92 kg)   BMI 26.76 kg/m     Wt Readings from Last 3 Encounters:  02/07/20 202 lb 12.8 oz (92 kg)  06/10/18 215 lb 6.2 oz (97.7 kg)     GEN: Well nourished, well developed in no acute distress HEENT: Normal NECK: No JVD; No carotid bruits LYMPHATICS: No lymphadenopathy CARDIAC:  S1S2 noted,RRR, no murmurs, rubs, gallops RESPIRATORY:  Clear to auscultation without rales, wheezing or rhonchi  ABDOMEN: Soft, non-tender, non-distended, +bowel sounds, no guarding. EXTREMITIES: No edema, No cyanosis, no clubbing MUSCULOSKELETAL:  No deformity  SKIN: Warm and dry NEUROLOGIC:  Alert and oriented x 3, non-focal PSYCHIATRIC:  Normal affect, good insight  ASSESSMENT:    1. PVC (premature ventricular contraction)   2. Atypical chest pain    PLAN:     1.  His previous chest pain has resolved.  Now his issue is intermittent lightheadedness with shortness of breath.  His EKG today show evidence of PVC I like to place a monitor the patient to be able to understand if there is any significant PVC burden.  We will continue to monitor the patient with his chest pain recurred we will make recommendations as appropriate.  The patient is in agreement with the above plan. The patient left the office in stable  condition.  The patient will follow up in 3 months or sooner if needed.  Medication Adjustments/Labs and Tests Ordered: Current medicines are reviewed at length with the patient today.  Concerns regarding medicines are outlined above.  Orders Placed This Encounter  Procedures  . LONG TERM MONITOR (3-14 DAYS)  . EKG 12-Lead   No orders of the defined types were placed in this encounter.   Patient Instructions  Medication Instructions:  Your physician recommends that you continue on your current medications as directed. Please refer to the Current Medication list given to you today.  *If you need a refill on your cardiac medications before your next appointment, please call your pharmacy*   Lab Work: None If you have labs (blood work) drawn today and your tests are completely normal, you will receive your results only by: Marland Kitchen MyChart Message (if you have MyChart) OR . A paper copy in the mail If you have any lab test that is abnormal or we need to change your  treatment, we will call you to review the results.   Testing/Procedures: A zio monitor was ordered today. It will remain on for 7 days. You will then return monitor and event diary in provided box. It takes 1-2 weeks for report to be downloaded and returned to [place]. We will call you with the results. If monitor falls off or has orange flashing light, please call Zio for further instructions.      Follow-Up: At [facility], you and your health needs are our priority.  As part of our continuing mission to provide you with exceptional heart care, we have created designated Provider Care Teams.  These Care Teams include your primary Cardiologist (physician) and Advanced Practice Providers (APPs -  Physician Assistants and Nurse Practitioners) who all work together to provide you with the care you need, when you need it.  We recommend signing up for the patient portal called "MyChart".  Sign up information is provided on this After Visit Summary.  MyChart is used to connect with patients for Virtual Visits (Telemedicine).  Patients are able to view lab/test results, encounter notes, upcoming appointments, etc.  Non-urgent messages can be sent to your provider as well.   To learn more about what you can do with MyChart, go to [email or web address].    Your next appointment:   3 month(s)  The format for your next appointment:   In Person  Provider:   Berniece Salines, DO   Other Instructions      Adopting a Healthy Lifestyle.  Know what a healthy weight is for you (roughly BMI <25) and aim to maintain this   Aim for 7+ servings of fruits and vegetables daily   65-80+ fluid ounces of water or unsweet tea for healthy kidneys   Limit to max 1 drink of alcohol per day; avoid smoking/tobacco   Limit animal fats in diet for cholesterol and heart health - choose grass fed whenever available   Avoid highly processed foods, and foods high in saturated/trans fats   Aim for low stress - take  time to unwind and care for your mental health   Aim for 150 min of moderate intensity exercise weekly for heart health, and weights twice weekly for bone health   Aim for 7-9 hours of sleep daily   When it comes to diets, agreement about the perfect plan isnt easy to find, even among the experts. Experts at the [employer] developed an idea known as the  Healthy Eating Plate. Just imagine a plate divided into logical, healthy portions.   The emphasis is on diet quality:   Load up on vegetables and fruits - one-half of your plate: Aim for color and variety, and remember that potatoes dont count.   Go for whole grains - one-quarter of your plate: Whole wheat, barley, wheat berries, quinoa, oats, brown rice, and foods made with them. If you want pasta, go with whole wheat pasta.   Protein power - one-quarter of your plate: Fish, chicken, beans, and nuts are all healthy, versatile protein sources. Limit red meat.   The diet, however, does go beyond the plate, offering a few other suggestions.   Use healthy plant oils, such as olive, canola, soy, corn, sunflower and peanut. Check the labels, and avoid partially hydrogenated oil, which have unhealthy trans fats.   If youre thirsty, drink water. Coffee and tea are good in moderation, but skip sugary drinks and limit milk and dairy products to one or two daily servings.   The type of carbohydrate in the diet is more important than the amount. Some sources of carbohydrates, such as vegetables, fruits, whole grains, and beans-are healthier than others.   Finally, stay active  Signed, Berniece Salines, DO  02/07/2020 6:03 PM    [facility]

## 2020-02-07 NOTE — Patient Instructions (Signed)
Medication Instructions:  Your physician recommends that you continue on your current medications as directed. Please refer to the Current Medication list given to you today.  *If you need a refill on your cardiac medications before your next appointment, please call your pharmacy*   Lab Work: None If you have labs (blood work) drawn today and your tests are completely normal, you will receive your results only by: . MyChart Message (if you have MyChart) OR . A paper copy in the mail If you have any lab test that is abnormal or we need to change your treatment, we will call you to review the results.   Testing/Procedures: A zio monitor was ordered today. It will remain on for 7 days. You will then return monitor and event diary in provided box. It takes 1-2 weeks for report to be downloaded and returned to us. We will call you with the results. If monitor falls off or has orange flashing light, please call Zio for further instructions.      Follow-Up: At [facility], you and your health needs are our priority.  As part of our continuing mission to provide you with exceptional heart care, we have created designated Provider Care Teams.  These Care Teams include your primary Cardiologist (physician) and Advanced Practice Providers (APPs -  Physician Assistants and Nurse Practitioners) who all work together to provide you with the care you need, when you need it.  We recommend signing up for the patient portal called "MyChart".  Sign up information is provided on this After Visit Summary.  MyChart is used to connect with patients for Virtual Visits (Telemedicine).  Patients are able to view lab/test results, encounter notes, upcoming appointments, etc.  Non-urgent messages can be sent to your provider as well.   To learn more about what you can do with MyChart, go to [email or web address].    Your next appointment:   3 month(s)  The format for your next appointment:   In  Person  Provider:   Kardie Tobb, DO   Other Instructions   

## 2020-03-06 ENCOUNTER — Telehealth: Payer: Self-pay

## 2020-03-06 NOTE — Telephone Encounter (Signed)
The patient has been notified of the result and verbalized understanding.  All questions (if any) were answered.  Spoke with patient regarding Dr. Mallory Shirk recommendation to begin low dose Propanolol. Patient states that the palpitations he has been experiencing are rare and not typically bothersome so he would like to try to stay off any medication at this time.   Patient states he has not been working out and would like to know if Dr. Servando Salina is okay with him resuming his regular work out routine now? Will route to KT, DO for advisement.  Leanord Hawking, RN 03/06/2020 4:24 PM

## 2020-03-06 NOTE — Telephone Encounter (Signed)
That is fine he can resume his regular workout routine.

## 2020-03-06 NOTE — Telephone Encounter (Signed)
-----   Message from Thomasene Ripple, DO sent at 03/06/2020  3:30 PM EST ----- Your monitor showed that you hav this e some fast heartbeat that starts from the top of the heart.  You also have occasional PVCs, if you are still experiencing palpitation I like to try you with low-dose propanolol 20 mg daily.

## 2020-03-08 NOTE — Telephone Encounter (Signed)
Spoke to the patient just now and let him know Dr. Mallory Shirk recommendation. He verbalizes understanding and thanks me for the call back.

## 2020-03-18 IMAGING — RF DG C-ARM 61-120 MIN
1 series · 2 of 2 positions shown · non-contrast
Comparison: None.

CLINICAL DATA: Left clavicle fixation

EXAM:
LEFT CLAVICLE - 2+ VIEWS; DG C-ARM 61-120 MIN

[Series 1: run · 2 of 2 slices shown]
[im 1/2]
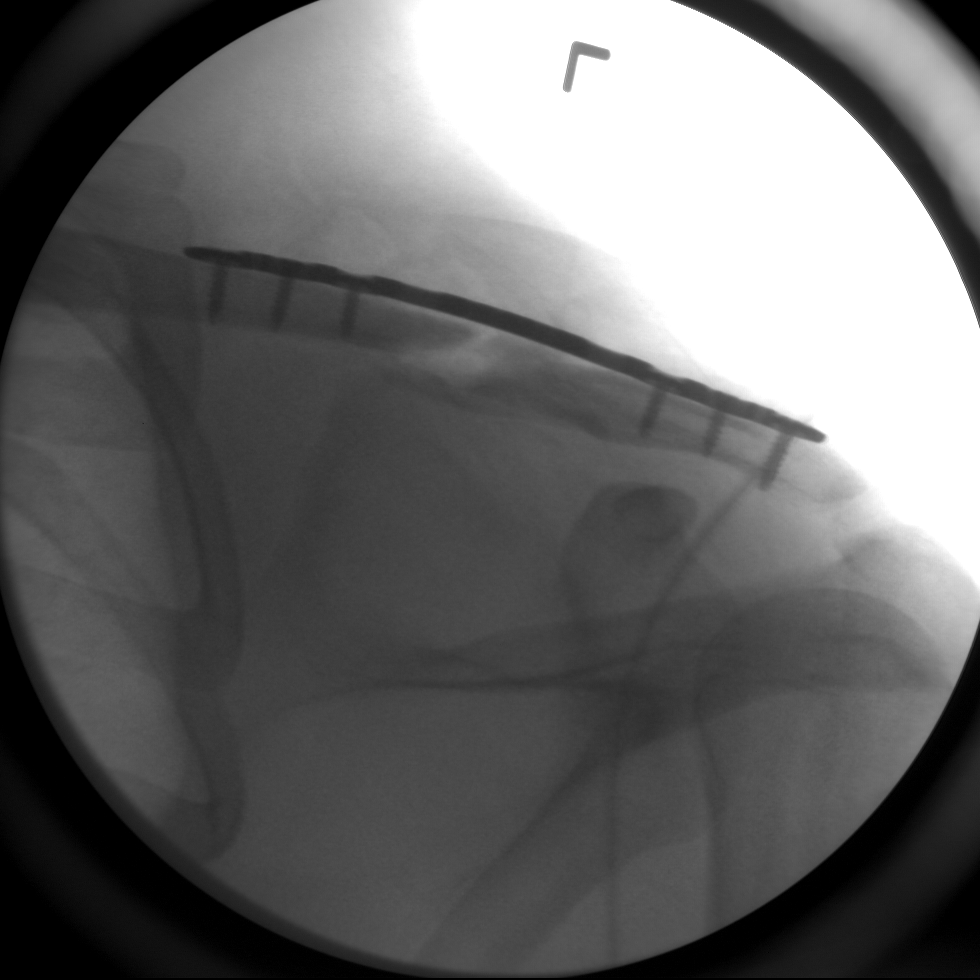
[im 2/2]
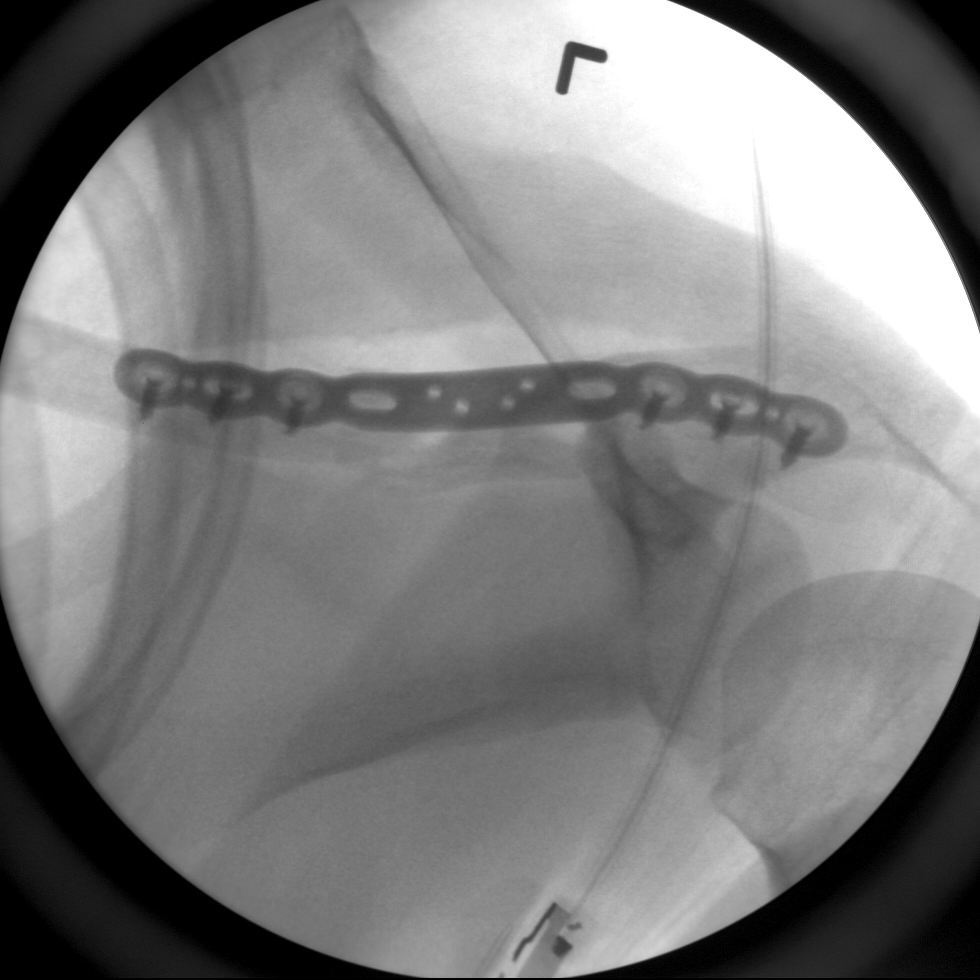

[2 of 2 positions shown; findings below may reference images not displayed]

FINDINGS: 2 intraoperative fluoroscopic images document plate and screw
fixation of a left midclavicular fracture, fragments in near
anatomic alignment.
IMPRESSION: ORIF left midclavicular fracture.

## 2020-05-09 ENCOUNTER — Ambulatory Visit: Payer: PRIVATE HEALTH INSURANCE | Admitting: Cardiology

## 2020-05-23 ENCOUNTER — Ambulatory Visit: Payer: PRIVATE HEALTH INSURANCE | Admitting: Cardiology
